# Patient Record
Sex: Male | Born: 1970 | Race: Black or African American | Hispanic: No | Marital: Single
Health system: Southern US, Community
[De-identification: ages and names within clinical notes are randomized; demographics above are authoritative.]

---

## 2016-10-18 ENCOUNTER — Emergency Department (HOSPITAL_COMMUNITY): Payer: Self-pay

## 2016-10-18 ENCOUNTER — Emergency Department (HOSPITAL_COMMUNITY)
Admission: EM | Admit: 2016-10-18 | Discharge: 2016-10-18 | Disposition: A | Discharge status: Home / Self Care | Payer: Self-pay | Attending: Emergency Medicine | Admitting: Emergency Medicine

## 2016-10-18 DIAGNOSIS — Y92410 Unspecified street and highway as the place of occurrence of the external cause: Secondary | ICD-10-CM | POA: Insufficient documentation

## 2016-10-18 DIAGNOSIS — Y939 Activity, unspecified: Secondary | ICD-10-CM | POA: Insufficient documentation

## 2016-10-18 DIAGNOSIS — S60221A Contusion of right hand, initial encounter: Secondary | ICD-10-CM | POA: Insufficient documentation

## 2016-10-18 DIAGNOSIS — W51XXXA Accidental striking against or bumped into by another person, initial encounter: Secondary | ICD-10-CM | POA: Insufficient documentation

## 2016-10-18 DIAGNOSIS — M79641 Pain in right hand: Secondary | ICD-10-CM

## 2016-10-18 DIAGNOSIS — Y999 Unspecified external cause status: Secondary | ICD-10-CM | POA: Insufficient documentation

## 2016-10-18 NOTE — Discharge Instructions (Signed)
Wear splint as needed for comfort. Ice and elevate hand  throughout the day, using ice pack for no more than 20 minutes every hour.  Alternate between tylenol and motrin as needed for pain relief. Call hand specialist follow up today or tomorrow to schedule followup appointment for recheck of ongoing hand pain in 5-7 days. Return to the ER for changes or worsening symptoms.

## 2016-10-18 NOTE — ED Notes (Signed)
Patient was alert, oriented and stable upon discharge. RN went over AVS and patient had no further questions.  
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
